# Patient Record
Sex: Female | Born: 1960 | Race: Asian | Hispanic: No | Marital: Married
Health system: Southern US, Community
[De-identification: ages and names within clinical notes are randomized; demographics above are authoritative.]

## PROBLEM LIST (undated history)

## (undated) DIAGNOSIS — B1910 Unspecified viral hepatitis B without hepatic coma: Secondary | ICD-10-CM

---

## 2017-09-28 ENCOUNTER — Emergency Department

## 2017-09-28 ENCOUNTER — Emergency Department
Admission: EM | Admit: 2017-09-28 | Discharge: 2017-09-28 | Disposition: A | Discharge status: Home / Self Care | Attending: Emergency Medicine | Admitting: Emergency Medicine

## 2017-09-28 ENCOUNTER — Encounter: Payer: Self-pay | Admitting: Emergency Medicine

## 2017-09-28 DIAGNOSIS — R202 Paresthesia of skin: Secondary | ICD-10-CM | POA: Diagnosis present

## 2017-09-28 HISTORY — DX: Unspecified viral hepatitis B without hepatic coma: B19.10

## 2017-09-28 LAB — COMPREHENSIVE METABOLIC PANEL
ALT: 22 U/L (ref 0–44)
ANION GAP: 8 (ref 5–15)
AST: 37 U/L (ref 15–41)
Albumin: 4.1 g/dL (ref 3.5–5.0)
Alkaline Phosphatase: 66 U/L (ref 38–126)
BILIRUBIN TOTAL: 0.7 mg/dL (ref 0.3–1.2)
BUN: 15 mg/dL (ref 6–20)
CALCIUM: 9.1 mg/dL (ref 8.9–10.3)
CO2: 27 mmol/L (ref 22–32)
CREATININE: 0.72 mg/dL (ref 0.44–1.00)
Chloride: 99 mmol/L (ref 98–111)
GFR calc non Af Amer: >60 mL/min (ref >60)
Glucose, Bld: 102 mg/dL — ABNORMAL HIGH (ref 70–99)
Potassium: 3.8 mmol/L (ref 3.5–5.1)
Sodium: 134 mmol/L — ABNORMAL LOW (ref 135–145)
TOTAL PROTEIN: 7.2 g/dL (ref 6.5–8.1)

## 2017-09-28 LAB — CBC
HEMATOCRIT: 33.8 % — AB (ref 35.0–47.0)
HEMOGLOBIN: 12.1 g/dL (ref 12.0–16.0)
MCH: 32.8 pg (ref 26.0–34.0)
MCHC: 35.9 g/dL (ref 32.0–36.0)
MCV: 91.2 fL (ref 80.0–100.0)
Platelets: 210 10*3/uL (ref 150–440)
RBC: 3.71 MIL/uL — AB (ref 3.80–5.20)
RDW: 12.5 % (ref 11.5–14.5)
WBC: 5 10*3/uL (ref 3.6–11.0)

## 2017-09-28 LAB — TROPONIN I: Troponin I: <0.03 ng/mL (ref <0.03)

## 2017-09-28 LAB — DIFFERENTIAL
Basophils Absolute: 0 10*3/uL (ref 0–0.1)
Basophils Relative: 1 %
EOS PCT: 1 %
Eosinophils Absolute: 0 10*3/uL (ref 0–0.7)
LYMPHS ABS: 2.3 10*3/uL (ref 1.0–3.6)
LYMPHS PCT: 44 %
MONO ABS: 0.4 10*3/uL (ref 0.2–0.9)
MONOS PCT: 9 %
Neutro Abs: 2.2 10*3/uL (ref 1.4–6.5)
Neutrophils Relative %: 45 %

## 2017-09-28 NOTE — ED Provider Notes (Signed)
[facility] Emergency Department Provider Note       Time seen: ----------------------------------------- 10:43 PM on 09/28/2017 -----------------------------------------   I have reviewed the triage vital signs and the nursing notes.  HISTORY   Chief Complaint Numbness    HPI Cindy York is a 57 y.o. female with a history of hepatitis B who presents to the ED for numbness to the top of her left foot since yesterday.  Patient also reports several hours ago she started having numbness to the left side of her face.  She has not had any weakness, vision, swallowing, balance or other neurologic abnormality.  She denies any recent illness or other complaints.  Past Medical History:  Diagnosis Date  . Hepatitis B     There are no active problems to display for this patient.   History reviewed. No pertinent surgical history.  Allergies Patient has no allergy information on record.  Social History Social History   Tobacco Use  . Smoking status: Not on file  Substance Use Topics  . Alcohol use: Not on file  . Drug use: Not on file   Review of Systems Constitutional: Negative for fever. Cardiovascular: Negative for chest pain. Respiratory: Negative for shortness of breath. Gastrointestinal: Negative for abdominal pain, vomiting and diarrhea. Musculoskeletal: Negative for back pain. Skin: Negative for rash. Neurological: Positive for numbness to the left foot and left face  All systems negative/normal/unremarkable except as stated in the HPI  ____________________________________________   PHYSICAL EXAM:  VITAL SIGNS: ED Triage Vitals  Enc Vitals Group     BP 09/28/17 2139 (!) 152/65     Pulse Rate 09/28/17 2139 (!) 49     Resp 09/28/17 2139 18     Temp 09/28/17 2139 97.7 F (36.5 C)     Temp Source 09/28/17 2139 Oral     SpO2 09/28/17 2139 100 %     Weight 09/28/17 2140 170 lb (77.1 kg)     Height 09/28/17 2140 5\' 5"  (1.651 m)     Head Circumference --      Peak Flow --      Pain Score 09/28/17 2140 0     Pain Loc --      Pain Edu? --      Excl. in GC? --    Constitutional: Alert and oriented. Well appearing and in no distress. Eyes: Conjunctivae are normal. Normal extraocular movements. ENT   Head: Normocephalic and atraumatic.   Nose: No congestion/rhinnorhea.   Mouth/Throat: Mucous membranes are moist.   Neck: No stridor. Cardiovascular: Normal rate, regular rhythm. No murmurs, rubs, or gallops. Respiratory: Normal respiratory effort without tachypnea nor retractions. Breath sounds are clear and equal bilaterally. No wheezes/rales/rhonchi. Gastrointestinal: Soft and nontender. Normal bowel sounds Musculoskeletal: Nontender with normal range of motion in extremities. No lower extremity tenderness nor edema. Neurologic:  Normal speech and language.  Paresthesias admitted to the left side of her face as well as the dorsum of the left foot and left tibial area distally.  Otherwise strength, sensation, cranial nerves are normal Skin:  Skin is warm, dry and intact. No rash noted. Psychiatric: Mood and affect are normal. Speech and behavior are normal.  ____________________________________________  EKG: Interpreted by me.  Sinus rhythm rate of 59 bpm, normal PR interval, wide QRS, low voltage, normal axis, normal QT  ____________________________________________  ED COURSE:  As part of my medical decision making, I reviewed the following data within the electronic MEDICAL RECORD NUMBER History obtained from family if  available, nursing notes, old chart and ekg, as well as notes from prior ED visits. Patient presented for numbness, we will assess with labs and imaging as indicated at this time.   Procedures ____________________________________________   LABS (pertinent positives/negatives)  Labs Reviewed  CBC - Abnormal; Notable for the following components:      Result Value   RBC 3.71 (*)    HCT  33.8 (*)    All other components within normal limits  COMPREHENSIVE METABOLIC PANEL - Abnormal; Notable for the following components:   Sodium 134 (*)    Glucose, Bld 102 (*)    All other components within normal limits  DIFFERENTIAL  TROPONIN I  CBG MONITORING, ED    RADIOLOGY Images were viewed by me  CT head is unremarkable  ____________________________________________  DIFFERENTIAL DIAGNOSIS   Paresthesia, anxiety, peripheral neuropathy, CVA unlikely  FINAL ASSESSMENT AND PLAN  Paresthesia   Plan: The patient had presented for numbness. Patient's labs are unremarkable. Patient's imaging did not reveal any acute process.  No clear etiology for her paresthesias.  She is cleared for outpatient follow-up   Ulice Dash, MD   Note: This note was generated in part or whole with voice recognition software. Voice recognition is usually quite accurate but there are transcription errors that can and very often do occur. I apologize for any typographical errors that were not detected and corrected.     Emily Filbert, MD 09/28/17 [phone]

## 2017-09-28 NOTE — ED Triage Notes (Signed)
Pt denies pain bu reports her numbness is a 4/10

## 2017-09-28 NOTE — ED Triage Notes (Signed)
Pt reports yesterday started with some numbness to the top of her left foot. Pt reports this evening a couple of hours ago started with numbness to the left side of her face. No facial drooping noted. Upper extremity grips equal bilaterally. Pt denies pain, headache, weakness, SOB, CP or other sx's.

## 2020-10-28 ENCOUNTER — Encounter: Payer: Self-pay | Admitting: Emergency Medicine

## 2020-10-28 ENCOUNTER — Emergency Department

## 2020-10-28 ENCOUNTER — Emergency Department
Admission: EM | Admit: 2020-10-28 | Discharge: 2020-10-28 | Disposition: A | Discharge status: Home / Self Care | Attending: Emergency Medicine | Admitting: Emergency Medicine

## 2020-10-28 ENCOUNTER — Other Ambulatory Visit: Payer: Self-pay

## 2020-10-28 DIAGNOSIS — M546 Pain in thoracic spine: Secondary | ICD-10-CM | POA: Insufficient documentation

## 2020-10-28 DIAGNOSIS — M545 Low back pain, unspecified: Secondary | ICD-10-CM | POA: Insufficient documentation

## 2020-10-28 DIAGNOSIS — M542 Cervicalgia: Secondary | ICD-10-CM | POA: Insufficient documentation

## 2020-10-28 DIAGNOSIS — Y92410 Unspecified street and highway as the place of occurrence of the external cause: Secondary | ICD-10-CM | POA: Insufficient documentation

## 2020-10-28 MED ORDER — CYCLOBENZAPRINE HCL 10 MG PO TABS
5.0000 mg | ORAL_TABLET | Freq: Once | ORAL | Status: DC
  Filled 2020-10-28: qty 1

## 2020-10-28 MED ORDER — CYCLOBENZAPRINE HCL 10 MG PO TABS
5.0000 mg | ORAL_TABLET | Freq: Once | ORAL | Status: AC
  Administered 2020-10-28: 5 mg via ORAL
  Filled 2020-10-28: qty 1

## 2020-10-28 MED ORDER — CYCLOBENZAPRINE HCL 5 MG PO TABS: 5.0000 mg | ORAL_TABLET | Freq: Three times a day (TID) | ORAL | 0 refills | Status: AC | PRN

## 2020-10-28 NOTE — Discharge Instructions (Signed)
You can take Flexeril at night before bed.
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
## 2020-10-28 NOTE — ED Notes (Signed)
Patient transported to X-ray 
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
